# Patient Record
Sex: Male | Born: 2005 | Race: Asian | Hispanic: No | Marital: Single | Smoking: Never smoker
Health system: Southern US, Community
[De-identification: ages and names within clinical notes are randomized; demographics above are authoritative.]

## PROBLEM LIST (undated history)

## (undated) DIAGNOSIS — K5904 Chronic idiopathic constipation: Secondary | ICD-10-CM

## (undated) DIAGNOSIS — J189 Pneumonia, unspecified organism: Secondary | ICD-10-CM

## (undated) HISTORY — PX: CIRCUMCISION: SUR203

## (undated) HISTORY — DX: Pneumonia, unspecified organism: J18.9

## (undated) HISTORY — DX: Chronic idiopathic constipation: K59.04

---
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
## 2023-06-24 ENCOUNTER — Encounter: Payer: Self-pay | Admitting: Pediatrics

## 2023-09-23 ENCOUNTER — Encounter: Payer: Self-pay | Admitting: Pediatrics

## 2023-09-23 ENCOUNTER — Ambulatory Visit (INDEPENDENT_AMBULATORY_CARE_PROVIDER_SITE_OTHER): Payer: 59 | Admitting: Pediatrics

## 2023-09-23 VITALS — BP 112/70 | Ht 64.0 in | Wt 140.3 lb

## 2023-09-23 DIAGNOSIS — Z23 Encounter for immunization: Secondary | ICD-10-CM | POA: Diagnosis not present

## 2023-09-23 DIAGNOSIS — Z68.41 Body mass index (BMI) pediatric, 5th percentile to less than 85th percentile for age: Secondary | ICD-10-CM | POA: Diagnosis not present

## 2023-09-23 DIAGNOSIS — Z1339 Encounter for screening examination for other mental health and behavioral disorders: Secondary | ICD-10-CM | POA: Diagnosis not present

## 2023-09-23 DIAGNOSIS — Z0000 Encounter for general adult medical examination without abnormal findings: Secondary | ICD-10-CM | POA: Insufficient documentation

## 2023-09-23 NOTE — Patient Instructions (Signed)
Preventive Care 18-19 Years Old, Male Preventive care refers to lifestyle choices and visits with your health care provider that can promote health and wellness. At this stage in your life, you may start seeing a primary care physician instead of a pediatrician for your preventive care. Preventive care visits are also called wellness exams. What can I expect for my preventive care visit? Counseling During your preventive care visit, your health care provider may ask about your: Medical history, including: Past medical problems. Family medical history. Current health, including: Home life and relationship well-being. Emotional well-being. Sexual activity and sexual health. Lifestyle, including: Alcohol, nicotine or tobacco, and drug use. Access to firearms. Diet, exercise, and sleep habits. Sunscreen use. Motor vehicle safety. Physical exam Your health care provider may check your: Height and weight. These may be used to calculate your BMI (body mass index). BMI is a measurement that tells if you are at a healthy weight. Waist circumference. This measures the distance around your waistline. This measurement also tells if you are at a healthy weight and may help predict your risk of certain diseases, such as type 2 diabetes and high blood pressure. Heart rate and blood pressure. Body temperature. Skin for abnormal spots. What immunizations do I need?  Vaccines are usually given at various ages, according to a schedule. Your health care provider will recommend vaccines for you based on your age, medical history, and lifestyle or other factors, such as travel or where you work. What tests do I need? Screening Your health care provider may recommend screening tests for certain conditions. This may include: Vision and hearing tests. Lipid and cholesterol levels. Hepatitis B test. Hepatitis C test. HIV (human immunodeficiency virus) test. STI (sexually transmitted infection) testing, if  you are at risk. Tuberculosis skin test. Talk with your health care provider about your test results, treatment options, and if necessary, the need for more tests. Follow these instructions at home: Eating and drinking  Eat a healthy diet that includes fresh fruits and vegetables, whole grains, lean protein, and low-fat dairy products. Drink enough fluid to keep your urine pale yellow. Do not drink alcohol if: Your health care provider tells you not to drink. You are under the legal drinking age. In the [place] the legal drinking age is 21. If you drink alcohol: Limit how much you have to 0-2 drinks a day. Know how much alcohol is in your drink. In the [place] one drink equals one 12 oz bottle of beer (355 mL), one 5 oz glass of wine (148 mL), or one 1 oz glass of hard liquor (44 mL). Lifestyle Brush your teeth every morning and night with fluoride toothpaste. Floss one time each day. Exercise for at least 30 minutes 5 or more days of the week. Do not use any products that contain nicotine or tobacco. These products include cigarettes, chewing tobacco, and vaping devices, such as e-cigarettes. If you need help quitting, ask your health care provider. Do not use drugs. If you are sexually active, practice safe sex. Use a condom or other form of protection to prevent STIs. Find healthy ways to manage stress, such as: Meditation, yoga, or listening to music. Journaling. Talking to a trusted person. Spending time with friends and family. Safety Always wear your seat belt while driving or riding in a vehicle. Do not drive: If you have been drinking alcohol. Do not ride with someone who has been drinking. When you are tired or distracted. While texting. If you have been using   any mind-altering substances or drugs. Wear a helmet and other protective equipment during sports activities. If you have firearms in your house, make sure you follow all gun safety procedures. Seek help if you have  been bullied, physically abused, or sexually abused. Use the internet responsibly to avoid dangers, such as online bullying and online sex predators. What's next? Go to your health care provider once a year for an annual wellness visit. Ask your health care provider how often you should have your eyes and teeth checked. Stay up to date on all vaccines. This information is not intended to replace advice given to you by your health care provider. Make sure you discuss any questions you have with your health care provider. Document Revised: 03/28/2021 Document Reviewed: 03/28/2021 Elsevier Patient Education  2024 [employer] Inc[employer]  

## 2023-09-23 NOTE — Progress Notes (Signed)
[facility]-- --[email or web address]   Subjective:    Garrett Scott is a 17 y.o. male who presents for Medicare Annual/Subsequent preventive examination.   Preventive Screening-Counseling & Management  Tobacco Social History   Tobacco Use  Smoking Status Never  Smokeless Tobacco Never    Current Problems (verified) Patient Active Problem List   Diagnosis Date Noted   Annual physical exam 09/23/2023   Encounter for PPD skin test reading 01/23/2022   BMI (body mass index), pediatric, 5% to less than 85% for age 17/01/2021   Well child check 07/02/2016    Medications Prior to Visit None   Current Medications (verified) No current outpatient medications on file.   No current facility-administered medications for this visit.     Allergies (verified) Patient has no known allergies.   PAST HISTORY   Social History Social History   Tobacco Use   Smoking status: Never   Smokeless tobacco: Never  Substance Use Topics   Alcohol use: No    Are there smokers in your home (other than you)?  No  Risk Factors Current exercise habits: Home exercise routine includes stretching.  Dietary issues discussed: yes   Cardiac risk factors: none.  Depression Screen (Note: if answer to either of the following is "Yes", a more complete depression screening is indicated)   Q1: Over the past two weeks, have you felt down, depressed or hopeless? No  Q2: Over the past two weeks, have you felt little interest or pleasure in doing things? No  Have you lost interest or pleasure in daily life? No  Do you often feel hopeless? No  Do you cry easily over simple problems? No   Immunization History  Administered Date(s) Administered   DTaP 06/11/2004, 08/14/2004, 10/30/2004, 07/11/2005, 05/12/2009   HIB (PRP-OMP) 06/11/2004, 08/14/2004, 10/30/2004, 07/11/2005   HPV 9-valent 05/29/2018, 06/30/2020   Hepatitis A 03/19/2005, 11/05/2005   Hepatitis B 22-May-2004, 06/11/2004, 01/28/2005    IPV 06/11/2004, 08/14/2004, 01/28/2005, 05/12/2009   Influenza Nasal 08/01/2009, 07/04/2011, 08/06/2012   Influenza, Seasonal, Injecte, Preservative Fre 09/23/2023   Influenza,Quad,Nasal, Live 06/30/2013, 08/19/2014   Influenza,inj,Quad PF,6+ Mos 08/04/2015, 06/27/2016, 08/07/2017, 05/29/2018, 07/22/2019, 06/30/2020, 08/15/2021   MMR 03/19/2005, 05/12/2009   MenQuadfi_Meningococcal Groups ACYW Conjugate 06/30/2020   Meningococcal Conjugate 06/27/2016   PFIZER(Purple Top)SARS-COV-2 Vaccination 05/12/2020, 06/02/2020, 11/17/2020   PPD Test 01/22/2022   Pfizer Covid-19 Vaccine Bivalent Booster 40yrs & up 11/09/2021   Pneumococcal Conjugate-13 06/11/2004, 08/14/2004, 10/30/2004, 07/11/2005   Tdap 06/27/2016   Varicella 03/19/2005, 05/12/2009    Screening Tests Health Maintenance  Topic Date Due   COVID-19 Vaccine (5 - 2023-24 season) 06/15/2023   Hepatitis C Screening  09/22/2024 (Originally 03/14/2022)   HIV Screening  09/22/2024 (Originally 03/15/2019)   DTaP/Tdap/Td (7 - Td or Tdap) 06/27/2026   INFLUENZA VACCINE  Completed   HPV VACCINES  Completed    All answers were reviewed with the patient and necessary referrals were made:  Georgiann Hahn, MD   09/23/2023   History reviewed: allergies, current medications, past family history, past medical history, past social history, past surgical history, and problem list  Review of Systems Pertinent items are noted in HPI.    Objective:     Vision by Snellen chart: right eye:20/20, left eye:20/20 Blood pressure 112/70, height 5\' 4"  (1.626 m), weight 140 lb 4.8 oz (63.6 kg). Body mass index is 24.08 kg/m.  BP 112/70   Ht 5\' 4"  (1.626 m)   Wt 140 lb 4.8 oz (63.6 kg)   BMI 24.08 kg/m  General appearance: alert, cooperative, and no distress Head: Normocephalic, without obvious abnormality Eyes: negative Ears: normal TM's and external ear canals both ears Nose: Nares normal. Septum midline. Mucosa normal. No drainage or sinus  tenderness. Throat: lips, mucosa, and tongue normal; teeth and gums normal Neck: no adenopathy and supple, symmetrical, trachea midline Back: negative, no kyphosis present, no scoliosis present, no skin lesions, erythema, or scars, symmetric, no curvature. ROM normal. No CVA tenderness. Lungs: clear to auscultation bilaterally Heart: regular rate and rhythm, S1, S2 normal, no murmur, click, rub or gallop Abdomen: soft, non-tender; bowel sounds normal; no masses,  no organomegaly Male genitalia: normal Extremities: extremities normal, atraumatic, no cyanosis or edema Pulses: 2+ and symmetric Skin: Skin color, texture, turgor normal. No rashes or lesions Neurologic: Grossly normal     Assessment:     Well male      Plan:     During the course of the visit the patient was educated and counseled about appropriate screening and preventive services including:   Influenza vaccine Diabetes screening Nutrition counseling  Orders Placed This Encounter  Procedures   Flu vaccine trivalent PF, 6mos and older(Flulaval,Afluria,Fluarix,Fluzone)   CBC with Differential/Platelet   Comprehensive metabolic panel   T4, free   TSH   Lipid panel      Patient Instructions (the written plan) was given to the patient.  Medicare Attestation I have personally reviewed: The patient's medical and social history Their use of alcohol, tobacco or illicit drugs Their current medications and supplements The patient's functional ability including ADLs,fall risks, home safety risks, cognitive, and hearing and visual impairment Diet and physical activities Evidence for depression or mood disorders  The patient's weight, height, BMI, and visual acuity have been recorded in the chart.  I have made referrals, counseling, and provided education to the patient based on review of the above and I have provided the patient with a written personalized care plan for preventive services.     Georgiann Hahn,  MD   09/23/2023

## 2023-09-24 LAB — CBC WITH DIFFERENTIAL/PLATELET
Absolute Lymphocytes: 2827 {cells}/uL (ref 850–3900)
Absolute Monocytes: 407 {cells}/uL (ref 200–950)
Basophils Absolute: 52 {cells}/uL (ref 0–200)
Basophils Relative: 0.7 %
Eosinophils Absolute: 518 {cells}/uL — ABNORMAL HIGH (ref 15–500)
Eosinophils Relative: 7 %
HCT: 47 % (ref 38.5–50.0)
Hemoglobin: 15.7 g/dL (ref 13.2–17.1)
MCH: 29.5 pg (ref 27.0–33.0)
MCHC: 33.4 g/dL (ref 32.0–36.0)
MCV: 88.2 fL (ref 80.0–100.0)
MPV: 9.3 fL (ref 7.5–12.5)
Monocytes Relative: 5.5 %
Neutro Abs: 3596 {cells}/uL (ref 1500–7800)
Neutrophils Relative %: 48.6 %
Platelets: 285 10*3/uL (ref 140–400)
RBC: 5.33 10*6/uL (ref 4.20–5.80)
RDW: 12.4 % (ref 11.0–15.0)
Total Lymphocyte: 38.2 %
WBC: 7.4 10*3/uL (ref 3.8–10.8)

## 2023-09-24 LAB — COMPREHENSIVE METABOLIC PANEL
AG Ratio: 1.7 (calc) (ref 1.0–2.5)
ALT: 30 U/L (ref 8–46)
AST: 21 U/L (ref 12–32)
Albumin: 4.8 g/dL (ref 3.6–5.1)
Alkaline phosphatase (APISO): 78 U/L (ref 46–169)
BUN/Creatinine Ratio: 23 (calc) — ABNORMAL HIGH (ref 6–22)
BUN: 21 mg/dL — ABNORMAL HIGH (ref 7–20)
CO2: 28 mmol/L (ref 20–32)
Calcium: 9.8 mg/dL (ref 8.9–10.4)
Chloride: 102 mmol/L (ref 98–110)
Creat: 0.9 mg/dL (ref 0.60–1.24)
Globulin: 2.8 g/dL (ref 2.1–3.5)
Glucose, Bld: 92 mg/dL (ref 65–99)
Potassium: 3.9 mmol/L (ref 3.8–5.1)
Sodium: 138 mmol/L (ref 135–146)
Total Bilirubin: 0.6 mg/dL (ref 0.2–1.1)
Total Protein: 7.6 g/dL (ref 6.3–8.2)

## 2023-09-24 LAB — LIPID PANEL
Cholesterol: 191 mg/dL — ABNORMAL HIGH (ref <170)
HDL: 55 mg/dL (ref >45)
LDL Cholesterol (Calc): 121 mg/dL — ABNORMAL HIGH (ref <110)
Non-HDL Cholesterol (Calc): 136 mg/dL — ABNORMAL HIGH (ref <120)
Total CHOL/HDL Ratio: 3.5 (calc) (ref <5.0)
Triglycerides: 54 mg/dL (ref <90)

## 2023-09-24 LAB — T4, FREE: Free T4: 1.5 ng/dL — ABNORMAL HIGH (ref 0.8–1.4)

## 2023-09-24 LAB — TSH: TSH: 3.33 m[IU]/L (ref 0.50–4.30)
